# Patient Record
Sex: Female | Born: 1990 | Hispanic: Yes | Marital: Single | Smoking: Current some day smoker
Health system: Southern US, Community
[De-identification: ages and names within clinical notes are randomized; demographics above are authoritative.]

## PROBLEM LIST (undated history)

## (undated) ENCOUNTER — Emergency Department (HOSPITAL_COMMUNITY): Admission: EM | Disposition: A | Discharge status: Home / Self Care | Payer: Medicaid Other

## (undated) DIAGNOSIS — N200 Calculus of kidney: Secondary | ICD-10-CM

## (undated) DIAGNOSIS — R112 Nausea with vomiting, unspecified: Secondary | ICD-10-CM

## (undated) DIAGNOSIS — Z972 Presence of dental prosthetic device (complete) (partial): Secondary | ICD-10-CM

## (undated) DIAGNOSIS — K429 Umbilical hernia without obstruction or gangrene: Secondary | ICD-10-CM

## (undated) DIAGNOSIS — L309 Dermatitis, unspecified: Secondary | ICD-10-CM

## (undated) DIAGNOSIS — L409 Psoriasis, unspecified: Secondary | ICD-10-CM

## (undated) DIAGNOSIS — Z9889 Other specified postprocedural states: Secondary | ICD-10-CM

## (undated) HISTORY — DX: Other specified postprocedural states: Z98.890

---

## 2016-09-21 ENCOUNTER — Emergency Department (HOSPITAL_COMMUNITY)
Admission: EM | Admit: 2016-09-21 | Discharge: 2016-09-21 | Disposition: A | Discharge status: Home / Self Care | Payer: Medicaid Other | Attending: Emergency Medicine | Admitting: Emergency Medicine

## 2016-09-21 ENCOUNTER — Encounter (HOSPITAL_COMMUNITY): Payer: Self-pay

## 2016-09-21 DIAGNOSIS — K047 Periapical abscess without sinus: Secondary | ICD-10-CM | POA: Insufficient documentation

## 2016-09-21 DIAGNOSIS — F17200 Nicotine dependence, unspecified, uncomplicated: Secondary | ICD-10-CM | POA: Insufficient documentation

## 2016-09-21 DIAGNOSIS — K0889 Other specified disorders of teeth and supporting structures: Secondary | ICD-10-CM | POA: Diagnosis present

## 2016-09-21 MED ORDER — HYDROCODONE-ACETAMINOPHEN 5-325 MG PO TABS
1.0000 | ORAL_TABLET | Freq: Four times a day (QID) | ORAL | 0 refills | Status: DC | PRN
Start: 2016-09-21 — End: 2022-04-14

## 2016-09-21 MED ORDER — PENICILLIN V POTASSIUM 500 MG PO TABS: 500.0000 mg | ORAL_TABLET | Freq: Four times a day (QID) | ORAL | 0 refills | Status: AC

## 2016-09-21 MED ORDER — IBUPROFEN 600 MG PO TABS: 600.0000 mg | ORAL_TABLET | Freq: Four times a day (QID) | ORAL | 0 refills | Status: DC | PRN

## 2016-09-21 NOTE — Discharge Instructions (Signed)
Take ibuprofen for pain. Take norco for severe pain only. Take penicillin as prescribed until all gone. Follow up with a dentist. Return if worsening.

## 2016-09-21 NOTE — ED Provider Notes (Signed)
[facility]-EMERGENCY DEPT Provider Note   CSN: [id number] Arrival date & time: 09/21/16  1302   By signing my name below, I, Clovis Pu, attest that this documentation has been prepared under the direction and in the presence of  Danyele Smejkal, PA-C. Electronically Signed: Clovis Pu, ED Scribe. 09/21/16. 2:38 PM.   History   Chief Complaint Chief Complaint  Patient presents with  . Dental Pain    The history is provided by the patient. No language interpreter was used.   HPI Comments:  Megan Cochran is a 26 y.o. female who presents to the Emergency Department complaining of moderate right lower dental pain x several days. Patient states she chipped her right lower tooth few years ago, but no prior infection. Pain with chewing. No pain with opening her mouth. Pt also reports facial swelling. Pt has taken ibuprofen with no relief. She denies any drainage and trouble swallowing. No fever or chills. Pt is not followed by a dentist.   History reviewed. No pertinent past medical history.  There are no active problems to display for this patient.   History reviewed. No pertinent surgical history.  OB History    No data available       Home Medications    Prior to Admission medications   Not on File    Family History No family history on file.  Social History Social History  Substance Use Topics  . Smoking status: Current Every Day Smoker  . Smokeless tobacco: Never Used  . Alcohol use Not on file     Allergies   Patient has no known allergies.   Review of Systems Review of Systems  Constitutional: Negative for chills and fever.  HENT: Positive for dental problem and facial swelling. Negative for trouble swallowing.    Physical Exam Updated Vital Signs BP 112/79 (BP Location: Left Arm)   Pulse 73   Temp 97.8 F (36.6 C) (Oral)   Resp 18   SpO2 98%   Physical Exam  Constitutional: She is oriented to person, place, and time. She appears well-developed  and well-nourished. No distress.  HENT:  Head: Normocephalic and atraumatic.  Widespread dental decay. Right lower first molar chipped to the gumline with surrounding erythema and gum swelling. Tender to touch. There is right lower mandibular swelling, tender to palpation.Trismus. No swelling of the tongue.  Eyes: Conjunctivae are normal.  Cardiovascular: Normal rate.   Pulmonary/Chest: Effort normal.  Abdominal: She exhibits no distension.  Neurological: She is alert and oriented to person, place, and time.  Skin: Skin is warm and dry.  Psychiatric: She has a normal mood and affect.  Nursing note and vitals reviewed.  ED Treatments / Results  DIAGNOSTIC STUDIES:  Oxygen Saturation is 98% on RA, normal by my interpretation.    COORDINATION OF CARE:  2:36 PM Discussed treatment plan with pt at bedside and pt agreed to plan.  Labs (all labs ordered are listed, but only abnormal results are displayed) Labs Reviewed - No data to display  EKG  EKG Interpretation None       Radiology No results found.  Procedures Procedures (including critical care time)  Medications Ordered in ED Medications - No data to display   Initial Impression / Assessment and Plan / ED Course  I have reviewed the triage vital signs and the nursing notes.  Pertinent labs & imaging results that were available during my care of the patient were reviewed by me and considered in my medical decision making (see  chart for details).  Clinical Course     Patient with multiple dental caries, here with acute onset of right facial swelling and increased pain. Exam was consistent with a dental abscess. No drainable pocket on the exam. Will start on penicillin, ibuprofen for pain, Norco for severe pain only. No evidence of ledwigs angina. No difficulty swallowing or breathing. Afebrile. Nontoxic appearing. Stable for discharge home. We'll give dental resources and follow-up with a dentist on call.  Final  Clinical Impressions(s) / ED Diagnoses   Final diagnoses:  Dental abscess    New Prescriptions New Prescriptions   HYDROCODONE-ACETAMINOPHEN (NORCO) 5-325 MG TABLET    Take 1 tablet by mouth every 6 (six) hours as needed for moderate pain.   IBUPROFEN (ADVIL,MOTRIN) 600 MG TABLET    Take 1 tablet (600 mg total) by mouth every 6 (six) hours as needed.   PENICILLIN V POTASSIUM (VEETID) 500 MG TABLET    Take 1 tablet (500 mg total) by mouth 4 (four) times daily.    I personally performed the services described in this documentation, which was scribed in my presence. The recorded information has been reviewed and is accurate.    Jaynie Crumbleatyana Jhoan Schmieder, PA-C 09/21/16 1450    Heide Scaleshristopher J Tegeler, MD 09/21/16 [phone]

## 2016-09-21 NOTE — ED Triage Notes (Signed)
Patient here with right lower broken tooth and gum swelling, pain to same, NAD

## 2016-09-21 NOTE — ED Notes (Signed)
Declined W/C at D/C and was escorted to lobby by RN. 

## 2017-01-22 ENCOUNTER — Emergency Department (HOSPITAL_BASED_OUTPATIENT_CLINIC_OR_DEPARTMENT_OTHER)
Admission: EM | Admit: 2017-01-22 | Discharge: 2017-01-22 | Disposition: A | Discharge status: Home / Self Care | Payer: Medicaid Other | Attending: Emergency Medicine | Admitting: Emergency Medicine

## 2017-01-22 ENCOUNTER — Emergency Department (HOSPITAL_BASED_OUTPATIENT_CLINIC_OR_DEPARTMENT_OTHER): Payer: Medicaid Other

## 2017-01-22 ENCOUNTER — Encounter (HOSPITAL_BASED_OUTPATIENT_CLINIC_OR_DEPARTMENT_OTHER): Payer: Self-pay | Admitting: *Deleted

## 2017-01-22 DIAGNOSIS — R569 Unspecified convulsions: Secondary | ICD-10-CM | POA: Diagnosis not present

## 2017-01-22 DIAGNOSIS — F17200 Nicotine dependence, unspecified, uncomplicated: Secondary | ICD-10-CM | POA: Insufficient documentation

## 2017-01-22 HISTORY — DX: Calculus of kidney: N20.0

## 2017-01-22 HISTORY — DX: Dermatitis, unspecified: L30.9

## 2017-01-22 HISTORY — DX: Umbilical hernia without obstruction or gangrene: K42.9

## 2017-01-22 LAB — CBC WITH DIFFERENTIAL/PLATELET
BASOS PCT: 0 %
Basophils Absolute: 0 10*3/uL (ref 0.0–0.1)
Eosinophils Absolute: 0.2 10*3/uL (ref 0.0–0.7)
Eosinophils Relative: 4 %
HEMATOCRIT: 38.6 % (ref 36.0–46.0)
Hemoglobin: 13.9 g/dL (ref 12.0–15.0)
LYMPHS PCT: 17 %
Lymphs Abs: 1 10*3/uL (ref 0.7–4.0)
MCH: 30.8 pg (ref 26.0–34.0)
MCHC: 36 g/dL (ref 30.0–36.0)
MCV: 85.4 fL (ref 78.0–100.0)
MONO ABS: 0.5 10*3/uL (ref 0.1–1.0)
MONOS PCT: 9 %
NEUTROS ABS: 4.2 10*3/uL (ref 1.7–7.7)
Neutrophils Relative %: 70 %
Platelets: 247 10*3/uL (ref 150–400)
RBC: 4.52 MIL/uL (ref 3.87–5.11)
RDW: 12 % (ref 11.5–15.5)
WBC: 5.9 10*3/uL (ref 4.0–10.5)

## 2017-01-22 LAB — COMPREHENSIVE METABOLIC PANEL
ALBUMIN: 4.5 g/dL (ref 3.5–5.0)
ALK PHOS: 41 U/L (ref 38–126)
ALT: 17 U/L (ref 14–54)
ANION GAP: 8 (ref 5–15)
AST: 20 U/L (ref 15–41)
BUN: 6 mg/dL (ref 6–20)
CALCIUM: 9 mg/dL (ref 8.9–10.3)
CO2: 23 mmol/L (ref 22–32)
Chloride: 105 mmol/L (ref 101–111)
Creatinine, Ser: 0.71 mg/dL (ref 0.44–1.00)
GFR calc Af Amer: >60 mL/min (ref >60)
GFR calc non Af Amer: >60 mL/min (ref >60)
GLUCOSE: 106 mg/dL — AB (ref 65–99)
Potassium: 3.3 mmol/L — ABNORMAL LOW (ref 3.5–5.1)
SODIUM: 136 mmol/L (ref 135–145)
Total Bilirubin: 0.4 mg/dL (ref 0.3–1.2)
Total Protein: 7.1 g/dL (ref 6.5–8.1)

## 2017-01-22 NOTE — ED Provider Notes (Signed)
[facility]   CSN: [id number] Arrival date & time: 01/22/17  [id number]   History   Chief Complaint Chief Complaint  Patient presents with  . Seizures    HPI Megan Cochran is a 26 y.o. female.  HPI  Patient presents to the ED with concern for seizure episode. Past medical history no significant diseases. Patient states that she was trying to fall asleep around 1:30 AM and had a difficult time falling asleep. She states that twice she felt her body tense up and shake for a couple seconds and her vision would go dark. This would last temporarily. After episode she was nervous and could not sleep. She finally "passed out" at around 3:30 AM. Her alarm went off at 4 AM. She was able to get up to turn alarm off. When returning back to bed patient fell asleep. Shortly after her boyfriend noticed that her body was tense and she was shaking. He states that he turned on the lights and saw her tense and shaking. She was staring at him and trying to talk but was unable to. Episode of shaking lasted 3-4 minutes. Patient had no tongue biting, or incontinence. Patient did not hit her head. Patient has never had episodes like this before. Patient returned to cognitive baseline about 5 minutes after episode but was able to get up and walk immediately. Boyfriend called EMS and EMS came to evaluate patient. She declined coming to the ED at that time.  She denies any lingering headache, weakness, vision changes. Denies any new medications. Patient denies any increased stress or anxiety in her life. No family history of seizure disorder.   Past Medical History:  Diagnosis Date  . Kidney stones   . Umbilical hernia     There are no active problems to display for this patient.   Past Surgical History:  Procedure Laterality Date  . CESAREAN SECTION     2008 twins, 2010, 2012    OB History    No data available      Home Medications    Prior to Admission medications     Medication Sig Start Date End Date Taking? Authorizing Provider  ibuprofen (ADVIL,MOTRIN) 600 MG tablet Take 1 tablet (600 mg total) by mouth every 6 (six) hours as needed. 09/21/16  Yes Kirichenko, Lemont Fillersatyana, PA-C  levonorgestrel (MIRENA) 20 MCG/24HR IUD 1 each by Intrauterine route once.   Yes [provider]  HYDROcodone-acetaminophen (NORCO) 5-325 MG tablet Take 1 tablet by mouth every 6 (six) hours as needed for moderate pain. 09/21/16   Jaynie CrumbleKirichenko, Tatyana, PA-C    Family History No family history on file.  Social History Social History  Substance Use Topics  . Smoking status: Current Every Day Smoker  . Smokeless tobacco: Never Used  . Alcohol use Not on file    Allergies   Patient has no known allergies.   Review of Systems Review of Systems  Constitutional: Positive for fatigue. Negative for fever.  HENT: Positive for rhinorrhea.   Respiratory: Negative for shortness of breath.   Cardiovascular: Negative for chest pain.  Neurological: Negative for dizziness and weakness.   Also per HPI  Physical Exam Updated Vital Signs BP 123/87 (BP Location: Left Arm)   Pulse 64   Temp 99.2 F (37.3 C) (Oral)   Resp 16   Ht 5\' 1"  (1.549 m)   Wt 160 lb (72.6 kg)   LMP  (Exact Date)   SpO2 100%   BMI 30.23 kg/m  Physical Exam  Constitutional: She is oriented to person, place, and time. She appears well-developed and well-nourished. No distress.  HENT:  Head: Normocephalic and atraumatic.  Mouth/Throat: Oropharynx is clear and moist.  Eyes: Conjunctivae and EOM are normal. Pupils are equal, round, and reactive to light.  Neck: Normal range of motion. Neck supple. No thyromegaly present.  Cardiovascular: Normal rate, regular rhythm, normal heart sounds and intact distal pulses.   Pulmonary/Chest: Effort normal and breath sounds normal.  Abdominal: Soft. She exhibits no distension. There is no tenderness. There is no guarding.  Musculoskeletal: Normal range of  motion. She exhibits no edema, tenderness or deformity.  Neurological: She is alert and oriented to person, place, and time. She has normal strength. No cranial nerve deficit or sensory deficit. She exhibits normal muscle tone. Coordination normal.  Skin: Skin is warm and dry.  Psychiatric: She has a normal mood and affect.  Nursing note and vitals reviewed.   ED Treatments / Results  Labs (all labs ordered are listed, but only abnormal results are displayed) Labs Reviewed  COMPREHENSIVE METABOLIC PANEL - Abnormal; Notable for the following:       Result Value   Potassium 3.3 (*)    Glucose, Bld 106 (*)    All other components within normal limits  CBC WITH DIFFERENTIAL/PLATELET    EKG  EKG Interpretation None       Radiology Ct Head Wo Contrast  Result Date: 01/22/2017 CLINICAL DATA:  Possible seizure. EXAM: CT HEAD WITHOUT CONTRAST TECHNIQUE: Contiguous axial images were obtained from the base of the skull through the vertex without intravenous contrast. COMPARISON:  None. FINDINGS: Brain: No evidence of an acute infarct, acute hemorrhage, mass lesion, mass effect or hydrocephalus. Vascular: No hyperdense vessel are unexpected calcification. Skull: Normal.  Negative for fracture or focal lesion. Sinuses/Orbits: No acute finding. Other: None. IMPRESSION: Normal exam. Electronically Signed   By: Leanna Battles M.D.   On: 01/22/2017 08:52    Procedures Procedures (including critical care time)  Medications Ordered in ED Medications - No data to display   Initial Impression / Assessment and Plan / ED Course  I have reviewed the triage vital signs and the nursing notes.  Pertinent labs & imaging results that were available during my care of the patient were reviewed by me and considered in my medical decision making (see chart for details).  Patient presents to ED with seizure-like activity 1. History not concerning for true epileptic seizure. However cannot fully rule this  out. Did routine labs and imaging for new seizure. CT head was negative. Labs unremarkable. Patient's seizure activity could've been caused by decreased sleep and increased stress/anxiety. Information provided for her to follow-up with neurologist. Return precautions given.   Final Clinical Impressions(s) / ED Diagnoses   Final diagnoses:  Seizure-like activity [facility])    New Prescriptions New Prescriptions   No medications on file     Pincus Large, DO 01/22/17 1119    Nira Conn, MD 01/22/17 [phone]

## 2017-01-22 NOTE — ED Triage Notes (Signed)
Patient states this morning around 0130 am she went to bed and was having a hard time falling asleep.  States each time she tried to close her eyes, she would have uncontrollable shaking.  States she was finally able to fall asleep.  Around 0430, her alarm went off and her husband woke her up and told her to cut it off.  States when she laid back down, he noticed that her jaw was tight and her eyes were rolling back in her head, and she was shaking, which lasted for approximately 3 minutes.  States EMS called and advised patient to come to the ED.  States now she is very tired and has tooth pain.  No incontinence was noted.

## 2017-01-22 NOTE — Discharge Instructions (Signed)
Do not think you had a "real" seizure but seizure-like activity. Follow-up with neurology - number provided for full evaluation to rule-out seizure activity for sure No medications needed Try and get better rest and reduce stress/anxiety as this could have caused seizure-like activity All results were normal   Please establish with PCP to follow-up with

## 2017-04-04 ENCOUNTER — Encounter (HOSPITAL_BASED_OUTPATIENT_CLINIC_OR_DEPARTMENT_OTHER): Payer: Self-pay | Admitting: Emergency Medicine

## 2017-04-04 ENCOUNTER — Emergency Department (HOSPITAL_BASED_OUTPATIENT_CLINIC_OR_DEPARTMENT_OTHER)
Admission: EM | Admit: 2017-04-04 | Discharge: 2017-04-04 | Disposition: A | Discharge status: Home / Self Care | Payer: Medicaid Other | Attending: Emergency Medicine | Admitting: Emergency Medicine

## 2017-04-04 DIAGNOSIS — F17200 Nicotine dependence, unspecified, uncomplicated: Secondary | ICD-10-CM | POA: Insufficient documentation

## 2017-04-04 DIAGNOSIS — Z79899 Other long term (current) drug therapy: Secondary | ICD-10-CM | POA: Diagnosis not present

## 2017-04-04 DIAGNOSIS — M545 Low back pain, unspecified: Secondary | ICD-10-CM

## 2017-04-04 MED ORDER — NAPROXEN 500 MG PO TABS: 500.0000 mg | ORAL_TABLET | Freq: Two times a day (BID) | ORAL | 0 refills | Status: DC

## 2017-04-04 MED ORDER — HYDROMORPHONE HCL 4 MG PO TABS: 4.0000 mg | ORAL_TABLET | Freq: Four times a day (QID) | ORAL | 0 refills | Status: DC | PRN

## 2017-04-04 NOTE — Discharge Instructions (Signed)
Follow-up with sports medicine upstairs. Take the Naprosyn as directed on a regular basis for 7 days. Supplement with the hydromorphone as needed for pain relief.

## 2017-04-04 NOTE — ED Triage Notes (Signed)
Patient reports that she has had back pain x 2 - 3 days. Went to the Hospital last night and was given Baclofen and still in pain. Reports that it is going down her legs now

## 2017-04-04 NOTE — ED Provider Notes (Signed)
[facility]   CSN: [id number] Arrival date & time: 04/04/17  [id number]     History   Chief Complaint Chief Complaint  Patient presents with  . Back Pain    HPI Megan Cochran is a 26 y.o. female.  HPI  Past Medical History:  Diagnosis Date  . Eczema    Left hand  . Kidney stones   . Umbilical hernia     There are no active problems to display for this patient.   Past Surgical History:  Procedure Laterality Date  . CESAREAN SECTION     2008 twins, 2010, 2012    OB History    No data available       Home Medications    Prior to Admission medications   Medication Sig Start Date End Date Taking? Authorizing Provider  baclofen (LIORESAL) 10 MG tablet Take 10 mg by mouth 3 (three) times daily.   Yes [provider]  HYDROcodone-acetaminophen (NORCO) 5-325 MG tablet Take 1 tablet by mouth every 6 (six) hours as needed for moderate pain. 09/21/16   Kirichenko, Lemont Fillersatyana, PA-C  HYDROmorphone (DILAUDID) 4 MG tablet Take 1 tablet (4 mg total) by mouth every 6 (six) hours as needed for severe pain. 04/04/17   Vanetta MuldersZackowski, Teya Otterson, MD  ibuprofen (ADVIL,MOTRIN) 600 MG tablet Take 1 tablet (600 mg total) by mouth every 6 (six) hours as needed. 09/21/16   Kirichenko, Lemont Fillersatyana, PA-C  levonorgestrel (MIRENA) 20 MCG/24HR IUD 1 each by Intrauterine route once.    [provider]  naproxen (NAPROSYN) 500 MG tablet Take 1 tablet (500 mg total) by mouth 2 (two) times daily. 04/04/17   Vanetta MuldersZackowski, Glynis Hunsucker, MD    Family History History reviewed. No pertinent family history.  Social History Social History  Substance Use Topics  . Smoking status: Current Every Day Smoker  . Smokeless tobacco: Never Used  . Alcohol use No     Allergies   Hydrocodone and Penicillins   Review of Systems Review of Systems   Physical Exam Updated Vital Signs BP 102/72 (BP Location: Left Arm)   Pulse 66   Resp 18   Ht 1.575 m (5\' 2" )   Wt 69.4 kg (153 lb)    SpO2 100%   BMI 27.98 kg/m   Physical Exam   ED Treatments / Results  Labs (all labs ordered are listed, but only abnormal results are displayed) Labs Reviewed - No data to display  EKG  EKG Interpretation None       Radiology No results found.  Procedures Procedures (including critical care time)  Medications Ordered in ED Medications - No data to display   Initial Impression / Assessment and Plan / ED Course  I have reviewed the triage vital signs and the nursing notes.  Pertinent labs & imaging results that were available during my care of the patient were reviewed by me and considered in my medical decision making (see chart for details).     Patient with a complaint of low back pain radiating into the right buttocks area pain mostly on the right side present for 2 weeks. No distal neuro focal deficits. No history of injury. Patient's been on baclofen as well as a prednisone taper.  Patient does not have a primary care doctor locally. We'll give a short course of hydromorphone as well as Naprosyn and have her follow-up with sports medicine.  Final Clinical Impressions(s) / ED Diagnoses   Final diagnoses:  Acute right-sided low back pain without  sciatica    New Prescriptions New Prescriptions   HYDROMORPHONE (DILAUDID) 4 MG TABLET    Take 1 tablet (4 mg total) by mouth every 6 (six) hours as needed for severe pain.   NAPROXEN (NAPROSYN) 500 MG TABLET    Take 1 tablet (500 mg total) by mouth 2 (two) times daily.     Vanetta MuldersZackowski, Zephyra Bernardi, MD 04/04/17 2142

## 2017-04-04 NOTE — ED Notes (Signed)
ED Provider at bedside. 

## 2018-05-18 ENCOUNTER — Emergency Department (HOSPITAL_BASED_OUTPATIENT_CLINIC_OR_DEPARTMENT_OTHER)
Admission: EM | Admit: 2018-05-18 | Discharge: 2018-05-18 | Disposition: A | Discharge status: Home / Self Care | Payer: Medicaid Other | Attending: Emergency Medicine | Admitting: Emergency Medicine

## 2018-05-18 ENCOUNTER — Other Ambulatory Visit: Payer: Self-pay

## 2018-05-18 ENCOUNTER — Encounter (HOSPITAL_BASED_OUTPATIENT_CLINIC_OR_DEPARTMENT_OTHER): Payer: Self-pay

## 2018-05-18 DIAGNOSIS — F17210 Nicotine dependence, cigarettes, uncomplicated: Secondary | ICD-10-CM | POA: Insufficient documentation

## 2018-05-18 DIAGNOSIS — Z79899 Other long term (current) drug therapy: Secondary | ICD-10-CM | POA: Diagnosis not present

## 2018-05-18 DIAGNOSIS — R21 Rash and other nonspecific skin eruption: Secondary | ICD-10-CM | POA: Insufficient documentation

## 2018-05-18 MED ORDER — TRIAMCINOLONE ACETONIDE 0.1 % EX CREA: 1.0000 "application " | TOPICAL_CREAM | Freq: Two times a day (BID) | CUTANEOUS | 0 refills | Status: AC

## 2018-05-18 NOTE — Discharge Instructions (Addendum)
Mix triamcinolone with CeraVe and apply to rash. Follow-up with either PCP or dermatology, dermatology may require a referral from a primary care provider.

## 2018-05-18 NOTE — ED Triage Notes (Signed)
Pt c/o rash to both arms-hx of rash "forever"-worse x 1 week-NAD-steady gait

## 2018-05-18 NOTE — ED Provider Notes (Signed)
[facility] EMERGENCY DEPARTMENT Provider Note   CSN: [id number] Arrival date & time: 05/18/18  1210     History   Chief Complaint Chief Complaint  Patient presents with  . Rash    HPI Megan Cochran is a 27 y.o. female.  27 year old female presents with complaint of rash on her left forearm for several years, with itching, and the emergency room today because she feels that the rash is spreading up her left arm with new spots on her right arm as well.  Patient has a history of sensitive skin, also history of left hand dermatitis.  Denies using new soaps or lotions.  No other complaints or concerns.     Past Medical History:  Diagnosis Date  . Eczema    Left hand  . Kidney stones   . Umbilical hernia     There are no active problems to display for this patient.   Past Surgical History:  Procedure Laterality Date  . CESAREAN SECTION     2008 twins, 2010, 2012     OB History   None      Home Medications    Prior to Admission medications   Medication Sig Start Date End Date Taking? Authorizing Provider  baclofen (LIORESAL) 10 MG tablet Take 10 mg by mouth 3 (three) times daily.    [provider]  HYDROcodone-acetaminophen (NORCO) 5-325 MG tablet Take 1 tablet by mouth every 6 (six) hours as needed for moderate pain. 09/21/16   Kirichenko, Lemont Fillersatyana, PA-C  HYDROmorphone (DILAUDID) 4 MG tablet Take 1 tablet (4 mg total) by mouth every 6 (six) hours as needed for severe pain. 04/04/17   Vanetta MuldersZackowski, Scott, MD  ibuprofen (ADVIL,MOTRIN) 600 MG tablet Take 1 tablet (600 mg total) by mouth every 6 (six) hours as needed. 09/21/16   Kirichenko, Lemont Fillersatyana, PA-C  levonorgestrel (MIRENA) 20 MCG/24HR IUD 1 each by Intrauterine route once.    [provider]  naproxen (NAPROSYN) 500 MG tablet Take 1 tablet (500 mg total) by mouth 2 (two) times daily. 04/04/17   Vanetta MuldersZackowski, Scott, MD  triamcinolone cream (KENALOG) 0.1 % Apply 1 application topically 2 (two)  times daily. 05/18/18   Jeannie FendMurphy, Allona Gondek A, PA-C    Family History No family history on file.  Social History Social History   Tobacco Use  . Smoking status: Current Every Day Smoker    Types: Cigarettes  . Smokeless tobacco: Never Used  Substance Use Topics  . Alcohol use: Yes    Comment: occ  . Drug use: No     Allergies   Hydrocodone and Penicillins   Review of Systems Review of Systems  Constitutional: Negative for fever.  Musculoskeletal: Negative for arthralgias, joint swelling and myalgias.  Skin: Positive for rash. Negative for wound.  Allergic/Immunologic: Negative for immunocompromised state.  Neurological: Negative for weakness.  Hematological: Negative for adenopathy. Does not bruise/bleed easily.  All other systems reviewed and are negative.    Physical Exam Updated Vital Signs BP 115/64 (BP Location: Right Arm)   Pulse 65   Temp 98.7 F (37.1 C) (Oral)   Resp 18   Ht 5\' 1"  (1.549 m)   Wt 74.4 kg   SpO2 98%   BMI 30.99 kg/m   Physical Exam  Constitutional: She is oriented to person, place, and time. She appears well-developed and well-nourished. No distress.  HENT:  Head: Normocephalic and atraumatic.  Pulmonary/Chest: Effort normal.  Neurological: She is alert and oriented to person, place, and time. No  sensory deficit.  Skin: Skin is warm and dry. Rash noted. She is not diaphoretic.     Psychiatric: She has a normal mood and affect. Her behavior is normal.  Nursing note and vitals reviewed.    ED Treatments / Results  Labs (all labs ordered are listed, but only abnormal results are displayed) Labs Reviewed - No data to display  EKG None  Radiology No results found.  Procedures Procedures (including critical care time)  Medications Ordered in ED Medications - No data to display   Initial Impression / Assessment and Plan / ED Course  I have reviewed the triage vital signs and the nursing notes.  Pertinent labs & imaging  results that were available during my care of the patient were reviewed by me and considered in my medical decision making (see chart for details).  Clinical Course as of May 19 1251  Thu May 18, 2018  [id number] 27 year old female resents with chronic rash to left forearm, now spreading.  Requests referral to dermatology, does not have PCP for this referral.  Information given for area dermatologist, advised patient this referral may need to come from PCP and she will need to establish care with a PCP for that.  Patient can try applying triamcinolone and CeraVe to the area and see if this helps with her chronic rash.   [LM]    Clinical Course User Index [LM] Jeannie Fend, PA-C    Final Clinical Impressions(s) / ED Diagnoses   Final diagnoses:  Rash    ED Discharge Orders         Ordered    triamcinolone cream (KENALOG) 0.1 %  2 times daily     05/18/18 1247           Jeannie Fend, PA-C 05/18/18 1252    Pricilla Loveless, MD 05/18/18 1457

## 2018-05-28 IMAGING — CT CT HEAD W/O CM
3 of 4 series · 14 of 47 positions shown, 16 images · non-contrast
Comparison: None.

CLINICAL DATA: Possible seizure.

EXAM:
CT HEAD WITHOUT CONTRAST
TECHNIQUE: Contiguous axial images were obtained from the base of the skull
through the vertex without intravenous contrast.

[Series 2: head wo · axial · 0.39mm/px · z∈[-169,-59]mm · 8 of 30 slices shown, 10 images]
[im 4/30  brain]
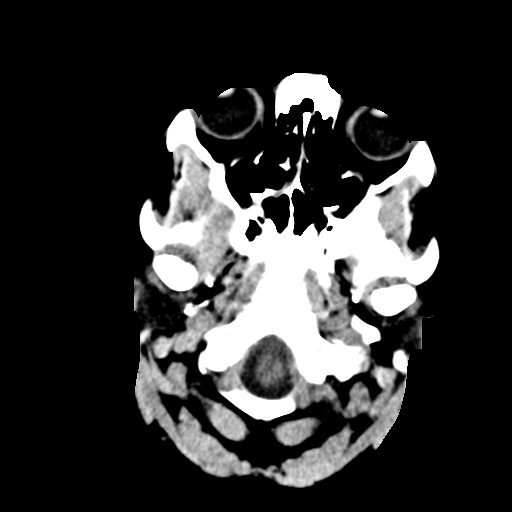
[im 4/30  bone]
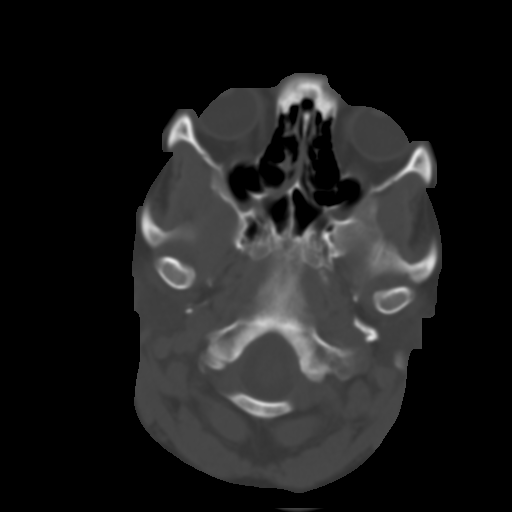
[im 7/30  brain]
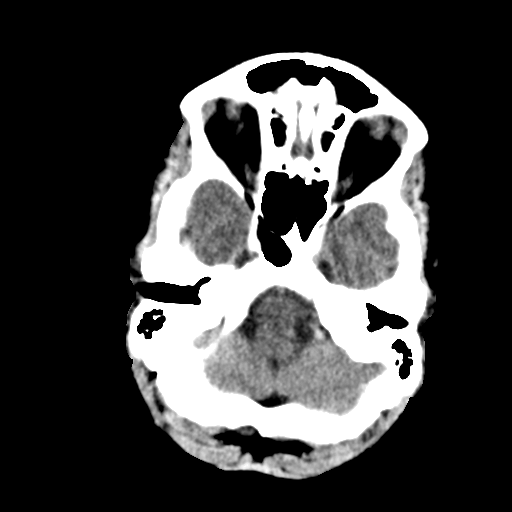
[im 10/30  brain]
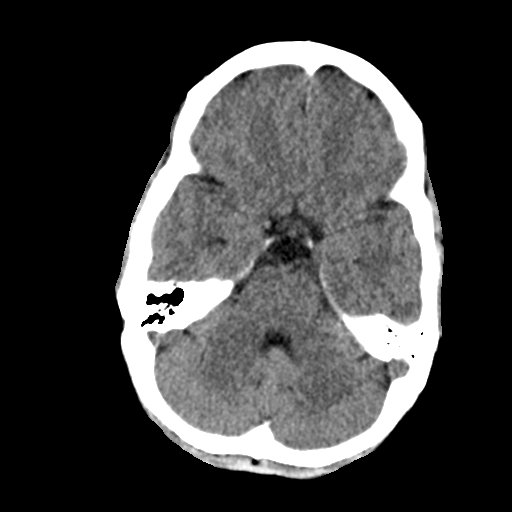
[im 13/30  brain]
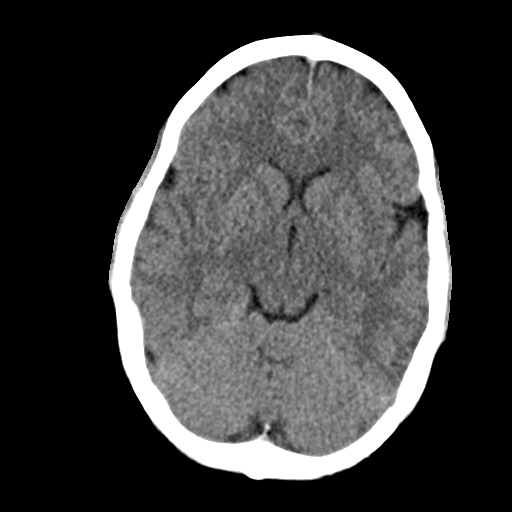
[im 17/30  brain]
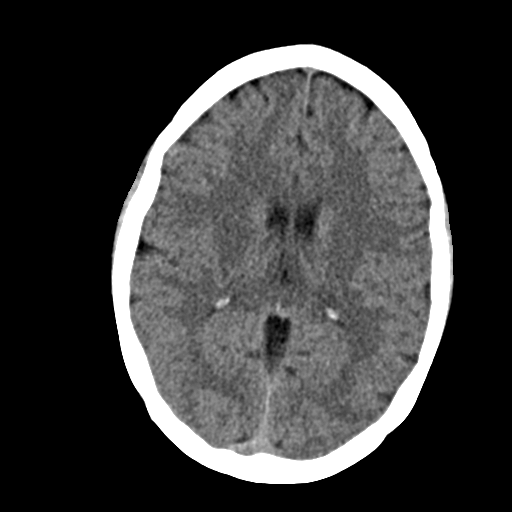
[im 17/30  bone]
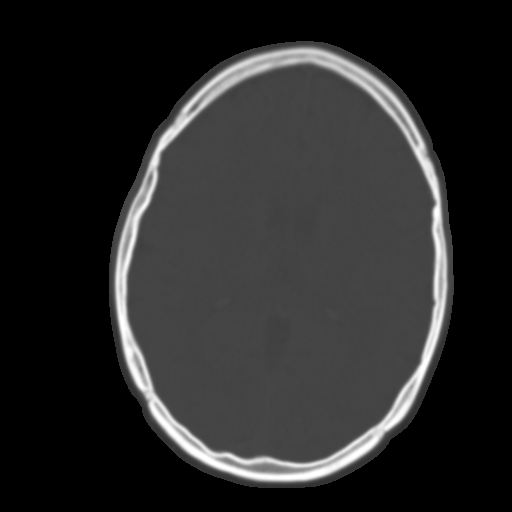
[im 20/30  brain]
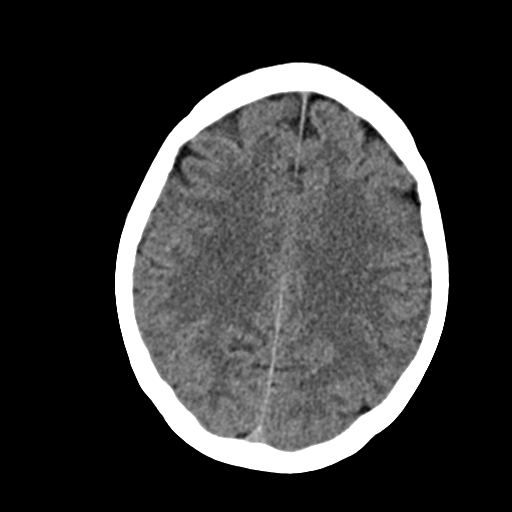
[im 23/30  brain]
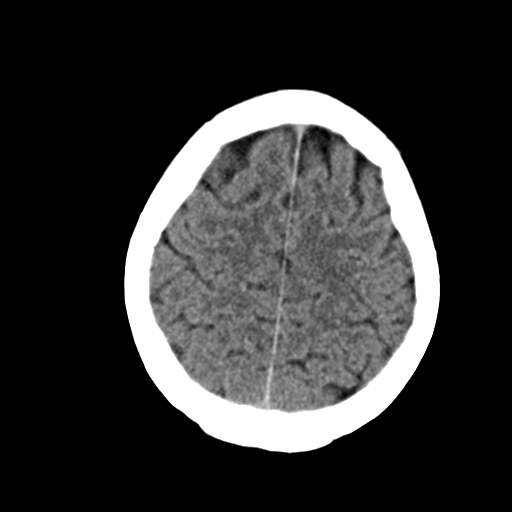
[im 26/30  brain]
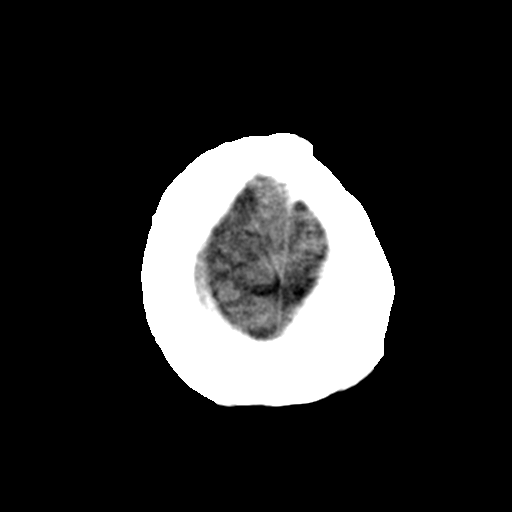

[Series 6: coronal soft · coronal · 0.29mm/px · 3 of 60 slices shown]
[im 20/60  brain]
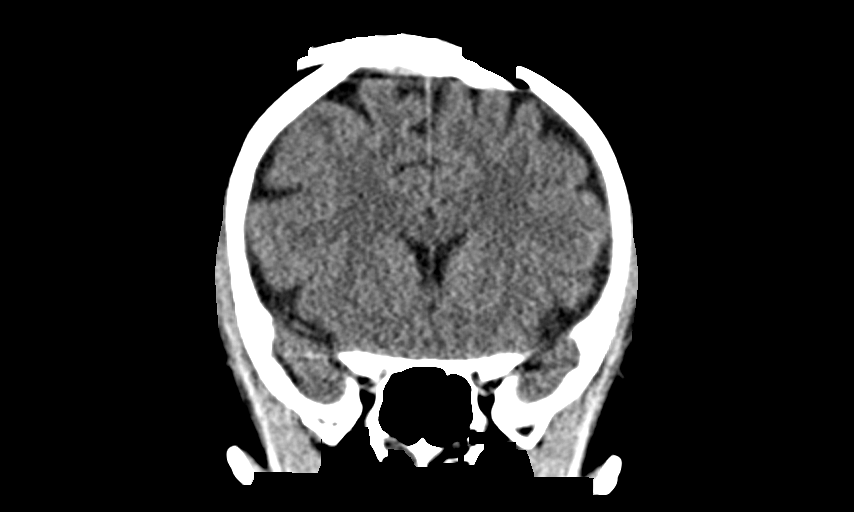
[im 27/60  brain]
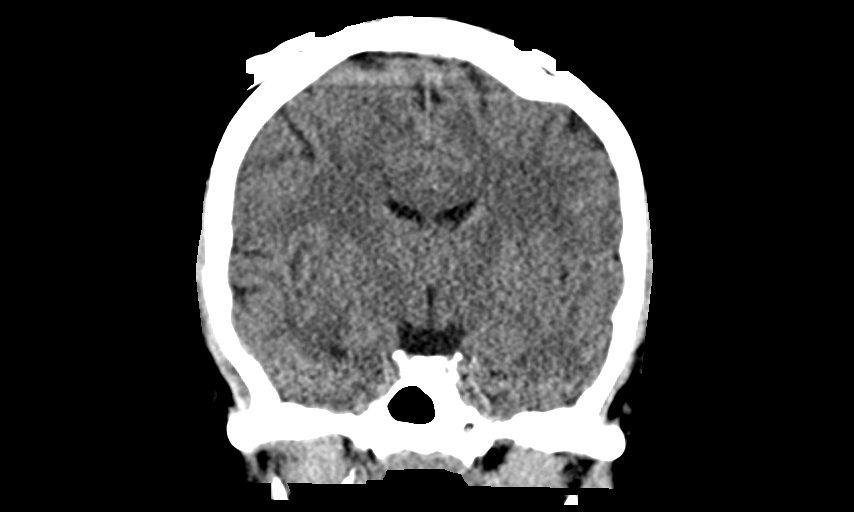
[im 33/60  brain]
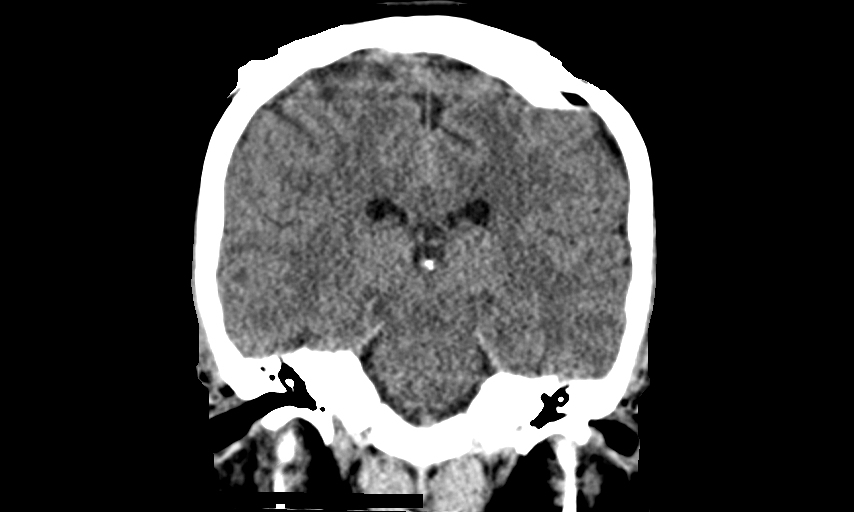

[Series 7: sag soft · sagittal · 0.29mm/px · 3 of 49 slices shown]
[im 17/49  brain]
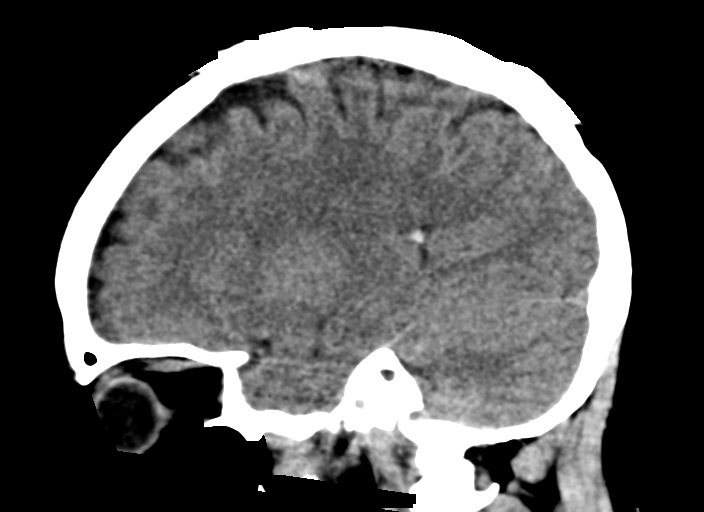
[im 25/49  brain]
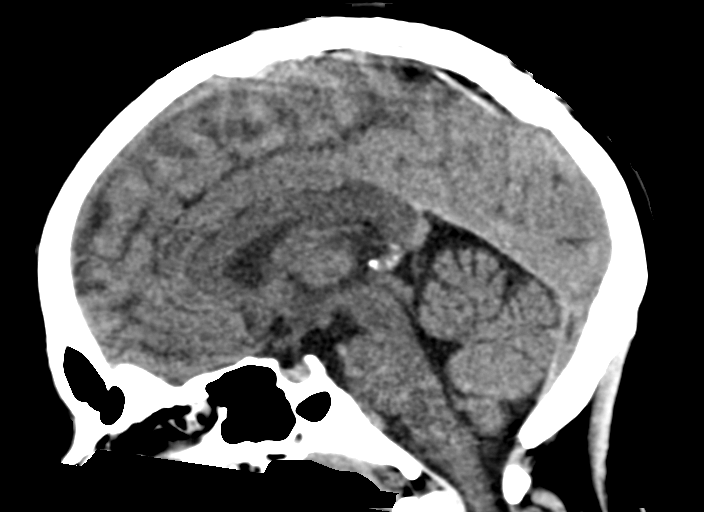
[im 33/49  brain]
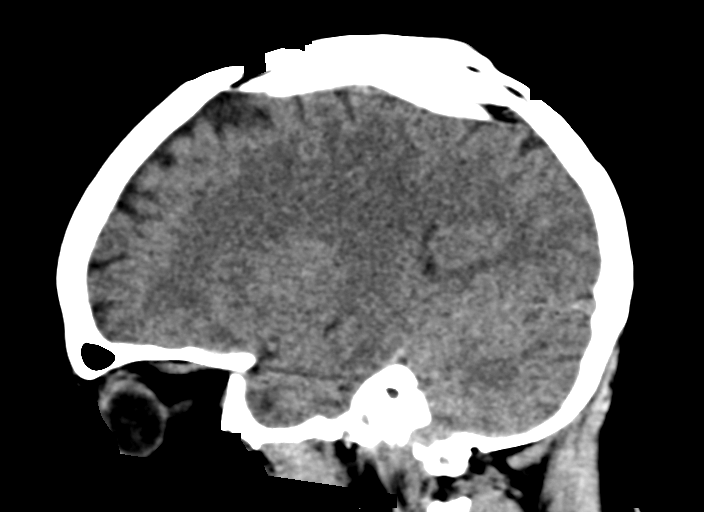

[14 of 47 positions shown; findings below may reference images not displayed]

FINDINGS: Brain: No evidence of an acute infarct, acute hemorrhage, mass
lesion, mass effect or hydrocephalus.

Vascular: No hyperdense vessel are unexpected calcification.

Skull: Normal.  Negative for fracture or focal lesion.

Sinuses/Orbits: No acute finding.

Other: None.
IMPRESSION: Normal exam.

## 2022-04-14 ENCOUNTER — Other Ambulatory Visit: Payer: Self-pay

## 2022-04-14 ENCOUNTER — Encounter (HOSPITAL_BASED_OUTPATIENT_CLINIC_OR_DEPARTMENT_OTHER): Payer: Self-pay | Admitting: Orthopedic Surgery

## 2022-04-14 ENCOUNTER — Other Ambulatory Visit: Payer: Self-pay | Admitting: Orthopedic Surgery

## 2022-04-14 NOTE — Anesthesia Preprocedure Evaluation (Signed)
Anesthesia Evaluation  Patient identified by MRN, date of birth, ID band Patient awake    Reviewed: Allergy & Precautions, NPO status , Patient's Chart, lab work & pertinent test results  History of Anesthesia Complications (+) PONV and history of anesthetic complications  Airway Mallampati: I  TM Distance: >3 FB Neck ROM: Full    Dental  (+) Missing, Dental Advisory Given   Pulmonary Current Smoker and Patient abstained from smoking.,    Pulmonary exam normal breath sounds clear to auscultation       Cardiovascular negative cardio ROS Normal cardiovascular exam Rhythm:Regular Rate:Normal     Neuro/Psych negative neurological ROS  negative psych ROS   GI/Hepatic negative GI ROS, Neg liver ROS,   Endo/Other  negative endocrine ROS  Renal/GU Renal diseaseHx/o renal calculi  negative genitourinary   Musculoskeletal Left hand abscess Psoriasis   Abdominal   Peds  Hematology negative hematology ROS (+)   Anesthesia Other Findings   Reproductive/Obstetrics negative OB ROS                            Anesthesia Physical Anesthesia Plan  ASA: 2  Anesthesia Plan: General   Post-op Pain Management: Precedex, Dilaudid IV and Ketamine IV*   Induction: Intravenous  PONV Risk Score and Plan: 4 or greater and Treatment may vary due to age or medical condition, Midazolam, Dexamethasone and Ondansetron  Airway Management Planned: LMA  Additional Equipment: None  Intra-op Plan:   Post-operative Plan: Extubation in OR  Informed Consent: I have reviewed the patients History and Physical, chart, labs and discussed the procedure including the risks, benefits and alternatives for the proposed anesthesia with the patient or authorized representative who has indicated his/her understanding and acceptance.     Dental advisory given  Plan Discussed with: CRNA and Anesthesiologist  Anesthesia  Plan Comments:        Anesthesia Quick Evaluation

## 2022-04-15 ENCOUNTER — Encounter (HOSPITAL_BASED_OUTPATIENT_CLINIC_OR_DEPARTMENT_OTHER)
Admission: RE | Disposition: A | Discharge status: Home / Self Care | Payer: Self-pay | Source: Home / Self Care | Attending: Orthopedic Surgery

## 2022-04-15 ENCOUNTER — Encounter (HOSPITAL_BASED_OUTPATIENT_CLINIC_OR_DEPARTMENT_OTHER): Payer: Self-pay | Admitting: Orthopedic Surgery

## 2022-04-15 ENCOUNTER — Ambulatory Visit (HOSPITAL_BASED_OUTPATIENT_CLINIC_OR_DEPARTMENT_OTHER)
Admission: RE | Admit: 2022-04-15 | Discharge: 2022-04-15 | Disposition: A | Discharge status: Home / Self Care | Payer: Medicaid Other | Attending: Orthopedic Surgery | Admitting: Orthopedic Surgery

## 2022-04-15 ENCOUNTER — Ambulatory Visit (HOSPITAL_BASED_OUTPATIENT_CLINIC_OR_DEPARTMENT_OTHER): Payer: Medicaid Other | Admitting: Anesthesiology

## 2022-04-15 ENCOUNTER — Other Ambulatory Visit: Payer: Self-pay

## 2022-04-15 DIAGNOSIS — Z419 Encounter for procedure for purposes other than remedying health state, unspecified: Secondary | ICD-10-CM

## 2022-04-15 DIAGNOSIS — F17200 Nicotine dependence, unspecified, uncomplicated: Secondary | ICD-10-CM | POA: Insufficient documentation

## 2022-04-15 DIAGNOSIS — L089 Local infection of the skin and subcutaneous tissue, unspecified: Secondary | ICD-10-CM | POA: Diagnosis not present

## 2022-04-15 DIAGNOSIS — L409 Psoriasis, unspecified: Secondary | ICD-10-CM | POA: Insufficient documentation

## 2022-04-15 DIAGNOSIS — S61452D Open bite of left hand, subsequent encounter: Secondary | ICD-10-CM | POA: Diagnosis present

## 2022-04-15 DIAGNOSIS — W540XXD Bitten by dog, subsequent encounter: Secondary | ICD-10-CM | POA: Insufficient documentation

## 2022-04-15 DIAGNOSIS — L0889 Other specified local infections of the skin and subcutaneous tissue: Secondary | ICD-10-CM | POA: Diagnosis not present

## 2022-04-15 DIAGNOSIS — S61452A Open bite of left hand, initial encounter: Secondary | ICD-10-CM | POA: Diagnosis not present

## 2022-04-15 HISTORY — DX: Psoriasis, unspecified: L40.9

## 2022-04-15 HISTORY — PX: INCISION AND DRAINAGE ABSCESS: SHX5864

## 2022-04-15 HISTORY — DX: Presence of dental prosthetic device (complete) (partial): Z97.2

## 2022-04-15 HISTORY — DX: Other specified postprocedural states: R11.2

## 2022-04-15 SURGERY — INCISION AND DRAINAGE, ABSCESS
Anesthesia: General | Laterality: Left

## 2022-04-15 MED ORDER — FENTANYL CITRATE (PF) 100 MCG/2ML IJ SOLN
INTRAMUSCULAR | Status: AC
  Filled 2022-04-15: qty 2

## 2022-04-15 MED ORDER — VANCOMYCIN HCL IN DEXTROSE 1-5 GM/200ML-% IV SOLN
INTRAVENOUS | Status: AC
  Filled 2022-04-15: qty 200

## 2022-04-15 MED ORDER — DEXAMETHASONE SODIUM PHOSPHATE 4 MG/ML IJ SOLN
INTRAMUSCULAR | Status: DC | PRN
  Administered 2022-04-15: 5 mg via INTRAVENOUS

## 2022-04-15 MED ORDER — ATROPINE SULFATE 0.4 MG/ML IV SOLN
INTRAVENOUS | Status: AC
  Filled 2022-04-15: qty 1

## 2022-04-15 MED ORDER — PHENYLEPHRINE 80 MCG/ML (10ML) SYRINGE FOR IV PUSH (FOR BLOOD PRESSURE SUPPORT)
PREFILLED_SYRINGE | INTRAVENOUS | Status: AC
  Filled 2022-04-15: qty 10

## 2022-04-15 MED ORDER — VANCOMYCIN HCL 1000 MG IV SOLR
INTRAVENOUS | Status: AC
  Filled 2022-04-15: qty 20

## 2022-04-15 MED ORDER — LIDOCAINE 2% (20 MG/ML) 5 ML SYRINGE
INTRAMUSCULAR | Status: AC
  Filled 2022-04-15: qty 5

## 2022-04-15 MED ORDER — LIDOCAINE HCL (CARDIAC) PF 100 MG/5ML IV SOSY
PREFILLED_SYRINGE | INTRAVENOUS | Status: DC | PRN
  Administered 2022-04-15 (×2): 50 mg via INTRAVENOUS

## 2022-04-15 MED ORDER — BUPIVACAINE HCL (PF) 0.25 % IJ SOLN
INTRAMUSCULAR | Status: AC
  Filled 2022-04-15: qty 30

## 2022-04-15 MED ORDER — ONDANSETRON HCL 4 MG/2ML IJ SOLN
INTRAMUSCULAR | Status: DC | PRN
  Administered 2022-04-15: 4 mg via INTRAVENOUS

## 2022-04-15 MED ORDER — BUPIVACAINE HCL (PF) 0.25 % IJ SOLN
INTRAMUSCULAR | Status: DC | PRN
  Administered 2022-04-15: 9 mL

## 2022-04-15 MED ORDER — TRAMADOL HCL 50 MG PO TABS: ORAL_TABLET | ORAL | 0 refills | Status: AC

## 2022-04-15 MED ORDER — ONDANSETRON HCL 4 MG/2ML IJ SOLN
INTRAMUSCULAR | Status: AC
  Filled 2022-04-15: qty 2

## 2022-04-15 MED ORDER — FENTANYL CITRATE (PF) 100 MCG/2ML IJ SOLN
25.0000 ug | INTRAMUSCULAR | Status: DC | PRN
  Administered 2022-04-15: 25 ug via INTRAVENOUS
  Administered 2022-04-15: 50 ug via INTRAVENOUS

## 2022-04-15 MED ORDER — 0.9 % SODIUM CHLORIDE (POUR BTL) OPTIME
TOPICAL | Status: DC | PRN
  Administered 2022-04-15: 100 mL

## 2022-04-15 MED ORDER — EPHEDRINE 5 MG/ML INJ
INTRAVENOUS | Status: AC
  Filled 2022-04-15: qty 5

## 2022-04-15 MED ORDER — CEFAZOLIN SODIUM-DEXTROSE 2-4 GM/100ML-% IV SOLN
INTRAVENOUS | Status: AC
  Filled 2022-04-15: qty 100

## 2022-04-15 MED ORDER — SUCCINYLCHOLINE CHLORIDE 200 MG/10ML IV SOSY
PREFILLED_SYRINGE | INTRAVENOUS | Status: AC
  Filled 2022-04-15: qty 10

## 2022-04-15 MED ORDER — MIDAZOLAM HCL 2 MG/2ML IJ SOLN
INTRAMUSCULAR | Status: AC
  Filled 2022-04-15: qty 2

## 2022-04-15 MED ORDER — OXYCODONE HCL 5 MG/5ML PO SOLN: 5.0000 mg | Freq: Once | ORAL | Status: DC | PRN

## 2022-04-15 MED ORDER — ONDANSETRON HCL 4 MG/2ML IJ SOLN: 4.0000 mg | Freq: Once | INTRAMUSCULAR | Status: DC | PRN

## 2022-04-15 MED ORDER — LACTATED RINGERS IV SOLN: INTRAVENOUS | Status: DC

## 2022-04-15 MED ORDER — FENTANYL CITRATE (PF) 100 MCG/2ML IJ SOLN
INTRAMUSCULAR | Status: DC | PRN
  Administered 2022-04-15: 50 ug via INTRAVENOUS

## 2022-04-15 MED ORDER — OXYCODONE HCL 5 MG PO TABS: 5.0000 mg | ORAL_TABLET | Freq: Once | ORAL | Status: DC | PRN

## 2022-04-15 MED ORDER — DEXAMETHASONE SODIUM PHOSPHATE 10 MG/ML IJ SOLN
INTRAMUSCULAR | Status: AC
  Filled 2022-04-15: qty 1

## 2022-04-15 MED ORDER — MIDAZOLAM HCL 5 MG/5ML IJ SOLN
INTRAMUSCULAR | Status: DC | PRN
  Administered 2022-04-15: 2 mg via INTRAVENOUS

## 2022-04-15 MED ORDER — VANCOMYCIN HCL 1000 MG IV SOLR
INTRAVENOUS | Status: DC | PRN
  Administered 2022-04-15: 1000 mg via INTRAVENOUS

## 2022-04-15 SURGICAL SUPPLY — 53 items
APL PRP STRL LF DISP 70% ISPRP (MISCELLANEOUS) ×1
BAG DECANTER FOR FLEXI CONT (MISCELLANEOUS) IMPLANT
BANDAGE GAUZE 1X75IN STRL (MISCELLANEOUS) IMPLANT
BLADE MINI RND TIP GREEN BEAV (BLADE) IMPLANT
BLADE SURG 15 STRL LF DISP TIS (BLADE) ×2 IMPLANT
BLADE SURG 15 STRL SS (BLADE) ×4
BNDG CMPR 75X11 PLY HI ABS (MISCELLANEOUS)
BNDG CMPR 9X4 STRL LF SNTH (GAUZE/BANDAGES/DRESSINGS)
BNDG COHESIVE 1X5 TAN STRL LF (GAUZE/BANDAGES/DRESSINGS) IMPLANT
BNDG ELASTIC 2X5.8 VLCR STR LF (GAUZE/BANDAGES/DRESSINGS) IMPLANT
BNDG ELASTIC 3X5.8 VLCR STR LF (GAUZE/BANDAGES/DRESSINGS) IMPLANT
BNDG ESMARK 4X9 LF (GAUZE/BANDAGES/DRESSINGS) IMPLANT
BNDG GAUZE 1X75IN STRL (MISCELLANEOUS)
BNDG GAUZE DERMACEA FLUFF (GAUZE/BANDAGES/DRESSINGS)
BNDG GAUZE DERMACEA FLUFF 4 (GAUZE/BANDAGES/DRESSINGS) IMPLANT
BNDG GZE DERMACEA 4 6PLY (GAUZE/BANDAGES/DRESSINGS)
CHLORAPREP W/TINT 26 (MISCELLANEOUS) ×2 IMPLANT
CORD BIPOLAR FORCEPS 12FT (ELECTRODE) ×2 IMPLANT
COVER BACK TABLE 60X90IN (DRAPES) ×2 IMPLANT
COVER MAYO STAND STRL (DRAPES) ×2 IMPLANT
CUFF TOURN SGL QUICK 18X4 (TOURNIQUET CUFF) ×2 IMPLANT
DRAPE EXTREMITY T 121X128X90 (DISPOSABLE) ×2 IMPLANT
DRAPE SURG 17X23 STRL (DRAPES) ×2 IMPLANT
GAUZE PACKING IODOFORM 1/4X15 (PACKING) ×1 IMPLANT
GAUZE SPONGE 4X4 12PLY STRL (GAUZE/BANDAGES/DRESSINGS) ×2 IMPLANT
GAUZE XEROFORM 1X8 LF (GAUZE/BANDAGES/DRESSINGS) ×2 IMPLANT
GLOVE BIO SURGEON STRL SZ7.5 (GLOVE) ×2 IMPLANT
GLOVE BIOGEL PI IND STRL 8 (GLOVE) ×1 IMPLANT
GLOVE BIOGEL PI INDICATOR 8 (GLOVE) ×1
GOWN STRL REUS W/ TWL LRG LVL3 (GOWN DISPOSABLE) ×1 IMPLANT
GOWN STRL REUS W/TWL LRG LVL3 (GOWN DISPOSABLE) ×2
GOWN STRL REUS W/TWL XL LVL3 (GOWN DISPOSABLE) ×2 IMPLANT
LOOP VESSEL MAXI BLUE (MISCELLANEOUS) IMPLANT
NDL HYPO 25X1 1.5 SAFETY (NEEDLE) IMPLANT
NEEDLE HYPO 25X1 1.5 SAFETY (NEEDLE) IMPLANT
NS IRRIG 1000ML POUR BTL (IV SOLUTION) ×2 IMPLANT
PACK BASIN DAY SURGERY FS (CUSTOM PROCEDURE TRAY) ×2 IMPLANT
PAD CAST 3X4 CTTN HI CHSV (CAST SUPPLIES) IMPLANT
PADDING CAST ABS 4INX4YD NS (CAST SUPPLIES) ×1
PADDING CAST ABS COTTON 4X4 ST (CAST SUPPLIES) ×1 IMPLANT
PADDING CAST COTTON 3X4 STRL (CAST SUPPLIES)
SPLINT PLASTER CAST XFAST 3X15 (CAST SUPPLIES) IMPLANT
SPLINT PLASTER XTRA FASTSET 3X (CAST SUPPLIES)
STOCKINETTE 4X48 STRL (DRAPES) ×2 IMPLANT
SUT ETHILON 4 0 PS 2 18 (SUTURE) IMPLANT
SWAB COLLECTION DEVICE MRSA (MISCELLANEOUS) IMPLANT
SWAB CULTURE ESWAB REG 1ML (MISCELLANEOUS) IMPLANT
SYR BULB EAR ULCER 3OZ GRN STR (SYRINGE) ×2 IMPLANT
SYR CONTROL 10ML LL (SYRINGE) IMPLANT
SYR TOOMEY 50ML (SYRINGE) IMPLANT
TOWEL GREEN STERILE FF (TOWEL DISPOSABLE) ×4 IMPLANT
TUBE FEEDING ENTERAL 5FR 16IN (TUBING) IMPLANT
UNDERPAD 30X36 HEAVY ABSORB (UNDERPADS AND DIAPERS) ×2 IMPLANT

## 2022-04-15 NOTE — H&P (Signed)
  Megan Cochran is an 31 y.o. female.   Chief Complaint: infected dog bite HPI: 31 yo female states she was bitten by dog 2 weeks ago on left hand.  Has had draining wound and swollen tender area with erythema.  Seen at [facility] and started on antibiotics and followed up in office.  She wishes to proceed with operative incision and drainage.  Allergies:  Allergies  Allergen Reactions   Hydrocodone Rash   Penicillins Rash    Past Medical History:  Diagnosis Date   Eczema    Left hand   Kidney stones    PONV (postoperative nausea and vomiting)    Psoriasis    Umbilical hernia    Wears dentures     Past Surgical History:  Procedure Laterality Date   CESAREAN SECTION     2008 twins, 2010, 2012    Family History: History reviewed. No pertinent family history.  Social History:   reports that she has been smoking cigarettes. She has never used smokeless tobacco. She reports current alcohol use. She reports that she does not use drugs.  Medications: Medications Prior to Admission  Medication Sig Dispense Refill   ibuprofen (ADVIL,MOTRIN) 600 MG tablet Take 1 tablet (600 mg total) by mouth every 6 (six) hours as needed. 30 tablet 0   triamcinolone cream (KENALOG) 0.1 % Apply 1 application topically 2 (two) times daily. 453.6 g 0   baclofen (LIORESAL) 10 MG tablet Take 10 mg by mouth 3 (three) times daily.     levonorgestrel (MIRENA) 20 MCG/24HR IUD 1 each by Intrauterine route once.     naproxen (NAPROSYN) 500 MG tablet Take 1 tablet (500 mg total) by mouth 2 (two) times daily. 14 tablet 0    No results found for this or any previous visit (from the past 48 hour(s)).  No results found.    Blood pressure 102/64, pulse 61, temperature 98.6 F (37 C), temperature source Oral, resp. rate 14, height 5\' 1"  (1.549 m), weight 59.2 kg, last menstrual period 04/10/2022, SpO2 99 %.  General appearance: alert, cooperative, and appears stated age Head: Normocephalic, without obvious  abnormality, atraumatic Neck: supple, symmetrical, trachea midline Extremities: Intact sensation and capillary refill all digits.  +epl/fpl/io.  Wound in hypothenar eminence left palm.  Fluctuant area in hypothenar eminence Pulses: 2+ and symmetric Skin: Skin color, texture, turgor normal. No rashes or lesions Neurologic: Grossly normal Incision/Wound: as above  Assessment/Plan Left palm abscess.  Plan incision and drainage.  Non operative and operative treatment options have been discussed with the patient and patient wishes to proceed with operative treatment. Risks, benefits, and alternatives of surgery have been discussed and the patient agrees with the plan of care.   06/10/2022 04/15/2022, 7:40 AM

## 2022-04-15 NOTE — Transfer of Care (Signed)
Immediate Anesthesia Transfer of Care Note  Patient: Megan Cochran  Procedure(s) Performed: INCISION AND DRAINAGE LEFT HAND ABSCESS (Left)  Patient Location: PACU  Anesthesia Type:General  Level of Consciousness: sedated  Airway & Oxygen Therapy: Patient Spontanous Breathing and Patient connected to face mask oxygen  Post-op Assessment: Report given to RN  Post vital signs: Reviewed and stable  Last Vitals:  Vitals Value Taken Time  BP    Temp    Pulse    Resp    SpO2      Last Pain:  Vitals:   04/15/22 0720  TempSrc: Oral  PainSc: 5          Complications: No notable events documented.

## 2022-04-15 NOTE — Anesthesia Procedure Notes (Signed)
Procedure Name: LMA Insertion Date/Time: 04/15/2022 7:53 AM  Performed by: Ronnette Hila, CRNAPre-anesthesia Checklist: Patient identified, Emergency Drugs available, Suction available and Patient being monitored Patient Re-evaluated:Patient Re-evaluated prior to induction Oxygen Delivery Method: Circle system utilized Preoxygenation: Pre-oxygenation with 100% oxygen Induction Type: IV induction Ventilation: Mask ventilation without difficulty LMA: LMA inserted LMA Size: 4.0 Number of attempts: 1 Airway Equipment and Method: Bite block Placement Confirmation: positive ETCO2 Tube secured with: Tape Dental Injury: Teeth and Oropharynx as per pre-operative assessment

## 2022-04-15 NOTE — Op Note (Addendum)
NAME: Megan Cochran MEDICAL RECORD NO: [id number] DATE OF BIRTH: 25-May-1991 FACILITY: [facility] LOCATION: [facility] PHYSICIAN: Tami Ribas, MD   OPERATIVE REPORT   DATE OF PROCEDURE: 04/15/22    PREOPERATIVE DIAGNOSIS: Left hand infected dog bite   POSTOPERATIVE DIAGNOSIS: Left hand infected dog bite   PROCEDURE: Incision and drainage left hand infected dog bite   SURGEON:  Betha Loa, M.D.   ASSISTANT: none   ANESTHESIA:  General   INTRAVENOUS FLUIDS:  Per anesthesia flow sheet.   ESTIMATED BLOOD LOSS:  Minimal.   COMPLICATIONS:  None.   SPECIMENS: Cultures to micro   TOURNIQUET TIME:    Total Tourniquet Time Documented: Upper Arm (Left) - 23 minutes Total: Upper Arm (Left) - 23 minutes    DISPOSITION:  Stable to PACU.   INDICATIONS: 31 year old female states she was bitten on the left hand by dog approximately 2 weeks ago.  She has seen the urgent care where she was started on antibiotics.  Follow-up in the office.  She had a swollen fluctuant area at the hypothenar eminence.  She wishes to proceed with incision and drainage in the operating room.  Risks, benefits and alternatives of surgery were discussed including the risks of blood loss, infection, damage to nerves, vessels, tendons, ligaments, bone for surgery, need for additional surgery, complications with wound healing, continued pain, stiffness, , need for repeat irrigation and debridement.  She voiced understanding of these risks and elected to proceed.  OPERATIVE COURSE:  After being identified preoperatively by myself,  the patient and I agreed on the procedure and site of the procedure.  The surgical site was marked.  Surgical consent had been signed. Antibiotics were held for intraoperative cultures. She was transferred to the operating room and placed on the operating table in supine position with the Left upper extremity on an arm board.  General anesthesia was induced by the  anesthesiologist.  Left upper extremity was prepped and draped in normal sterile orthopedic fashion.  A surgical pause was performed between the surgeons, anesthesia, and operating room staff and all were in agreement as to the patient, procedure, and site of procedure.  Tourniquet at the proximal aspect of the extremity was inflated to 250 mmHg after exsanguination of the arm with an Esmarch bandage.  The wound was opened.  There was granulation tissue within the wound.  There was some thin watery fluid.  No gross purulence was encountered.  The granulation tissue was carefully removed by scraping and with the pickups.  The wound was extended proximally and distally to allow better visualization.  The wound edges were sharply removed with a knife to remove devitalized skin edge.  There was some deep vitalized fat in the wound as well.  This was removed.  The area more toward the ulnar side where she was more swollen was opened using the scissors from this wound.  No gross purulence was encountered.  Cultures were taken for aerobes and anaerobes.  The wound went down to the fascia.  The fascia was opened slightly to allow visualization to ensure there was no evidence of infection deep to the fascia.  There was none noted.  The wound was then copiously irrigated with sterile saline.  Was packed with quarter inch endoform gauze.  Was injected with quarter percent plain Marcaine to aid in postoperative analgesia.  Wound was then dressed with sterile Xeroform 4 x 4's and ABD and wrapped with Kerlix and Ace bandage.  The tourniquet was  deflated at 23 minutes.  Fingertips were pink with brisk capillary refill after deflation of tourniquet.  The operative  drapes were broken down.  The patient was awoken from anesthesia safely.  She was transferred back to the stretcher and taken to PACU in stable condition.  I will see her back in the office in 3-4 days for postoperative followup.  I will give her a prescription for  Tramadol 50 mg 1-2 tabs PO q6 hours prn pain, dispense # 20.  She has a prescription for trimethoprim/sulfamethoxazole and clindamycin from the urgent care already.   Betha Loa, MD Electronically signed, 04/15/22

## 2022-04-15 NOTE — Anesthesia Postprocedure Evaluation (Signed)
Anesthesia Post Note  Patient: Kailani Brass  Procedure(s) Performed: INCISION AND DRAINAGE LEFT HAND ABSCESS (Left)     Patient location during evaluation: PACU Anesthesia Type: General Level of consciousness: awake and alert and oriented Pain management: pain level controlled Vital Signs Assessment: post-procedure vital signs reviewed and stable Respiratory status: spontaneous breathing, nonlabored ventilation and respiratory function stable Cardiovascular status: blood pressure returned to baseline and stable Postop Assessment: no apparent nausea or vomiting Anesthetic complications: no   No notable events documented.  Last Vitals:  Vitals:   04/15/22 0850 04/15/22 0900  BP:  114/77  Pulse: 85 78  Resp: 16 13  Temp:    SpO2: 100% 98%    Last Pain:  Vitals:   04/15/22 0858  TempSrc:   PainSc: 0-No pain                 Reighlyn Elmes A.

## 2022-04-15 NOTE — Discharge Instructions (Addendum)

## 2022-04-16 ENCOUNTER — Encounter (HOSPITAL_BASED_OUTPATIENT_CLINIC_OR_DEPARTMENT_OTHER): Payer: Self-pay | Admitting: Orthopedic Surgery

## 2022-04-20 LAB — AEROBIC/ANAEROBIC CULTURE W GRAM STAIN (SURGICAL/DEEP WOUND): Culture: NORMAL

## 2022-10-27 ENCOUNTER — Inpatient Hospital Stay (HOSPITAL_COMMUNITY): Payer: Medicaid Other

## 2022-10-27 ENCOUNTER — Inpatient Hospital Stay (HOSPITAL_COMMUNITY)
Admission: AD | Admit: 2022-10-27 | Discharge: 2022-10-27 | Disposition: A | Discharge status: Home / Self Care | Payer: Medicaid Other | Attending: Obstetrics and Gynecology | Admitting: Obstetrics and Gynecology

## 2022-10-27 DIAGNOSIS — Z3A00 Weeks of gestation of pregnancy not specified: Secondary | ICD-10-CM | POA: Diagnosis not present

## 2022-10-27 DIAGNOSIS — R109 Unspecified abdominal pain: Secondary | ICD-10-CM | POA: Diagnosis not present

## 2022-10-27 DIAGNOSIS — O26899 Other specified pregnancy related conditions, unspecified trimester: Secondary | ICD-10-CM | POA: Insufficient documentation

## 2022-10-27 DIAGNOSIS — Z79899 Other long term (current) drug therapy: Secondary | ICD-10-CM | POA: Insufficient documentation

## 2022-10-27 DIAGNOSIS — Z791 Long term (current) use of non-steroidal anti-inflammatories (NSAID): Secondary | ICD-10-CM | POA: Insufficient documentation

## 2022-10-27 DIAGNOSIS — O3680X0 Pregnancy with inconclusive fetal viability, not applicable or unspecified: Secondary | ICD-10-CM | POA: Diagnosis not present

## 2022-10-27 LAB — COMPREHENSIVE METABOLIC PANEL
ALT: 8 U/L (ref 0–44)
AST: 15 U/L (ref 15–41)
Albumin: 3.8 g/dL (ref 3.5–5.0)
Alkaline Phosphatase: 34 U/L — ABNORMAL LOW (ref 38–126)
Anion gap: 8 (ref 5–15)
BUN: 6 mg/dL (ref 6–20)
CO2: 24 mmol/L (ref 22–32)
Calcium: 9.3 mg/dL (ref 8.9–10.3)
Chloride: 106 mmol/L (ref 98–111)
Creatinine, Ser: 0.66 mg/dL (ref 0.44–1.00)
GFR, Estimated: >60 mL/min (ref >60)
Glucose, Bld: 98 mg/dL (ref 70–99)
Potassium: 4 mmol/L (ref 3.5–5.1)
Sodium: 138 mmol/L (ref 135–145)
Total Bilirubin: 0.5 mg/dL (ref 0.3–1.2)
Total Protein: 6.3 g/dL — ABNORMAL LOW (ref 6.5–8.1)

## 2022-10-27 LAB — CBC
HCT: 39 % (ref 36.0–46.0)
Hemoglobin: 13.1 g/dL (ref 12.0–15.0)
MCH: 30.8 pg (ref 26.0–34.0)
MCHC: 33.6 g/dL (ref 30.0–36.0)
MCV: 91.8 fL (ref 80.0–100.0)
Platelets: 319 10*3/uL (ref 150–400)
RBC: 4.25 MIL/uL (ref 3.87–5.11)
RDW: 11.6 % (ref 11.5–15.5)
WBC: 6.5 10*3/uL (ref 4.0–10.5)
nRBC: 0 % (ref 0.0–0.2)

## 2022-10-27 LAB — POCT PREGNANCY, URINE: Preg Test, Ur: POSITIVE — AB

## 2022-10-27 LAB — ABO/RH
ABO/RH(D): O NEG
Antibody Screen: NEGATIVE

## 2022-10-27 LAB — HCG, QUANTITATIVE, PREGNANCY: hCG, Beta Chain, Quant, S: 7 m[IU]/mL — ABNORMAL HIGH (ref <5)

## 2022-10-27 MED ORDER — RHO D IMMUNE GLOBULIN 1500 UNIT/2ML IJ SOSY
300.0000 ug | PREFILLED_SYRINGE | Freq: Once | INTRAMUSCULAR | Status: AC
  Administered 2022-10-27: 300 ug via INTRAMUSCULAR
  Filled 2022-10-27: qty 2

## 2022-10-27 NOTE — MAU Note (Addendum)
.  Megan Cochran is a 32 y.o. at Unknown here in MAU reporting: Vaginal bleeding after intercourse this morning that is dark red that occurs with wiping. Denies vaginal discharge prior to bleeding. Denies vaginal odors. Positive preg test on 1/18 with [facility] in system.  She reports her LMP was sometime in December. She reports she started bleeding heavily towards the end of February and she reports she bled for 2-3 weeks then her bleeding stopped. She reports she took a pregnancy test after the bleeding stopped and the test was negative.  Positive pregnancy test this morning at home.  Faint positive pregnancy test in MAU.   LMP: December sometime Onset of complaint: this morning Pain score: Denies pain.   Lab orders placed from triage:  POCT Preg

## 2022-10-27 NOTE — MAU Provider Note (Signed)
History     [id number]  Arrival date and time: 10/27/22 1107    Chief Complaint  Patient presents with   Vaginal Bleeding     HPI Megan Cochran is a 32 y.o. at Unknown by LMP with PMHx notable for cesarean x3, short cervix in first pregnancy which was also twin gestation, who presents for vaginal bleeding.   Patient reports she had a positive pregnancy test in December (two months ago)  She had it confirmed one month ago and had a new OB visit scheduled for the beginning of this month, but in between the confirmation and presenting to OB she had a miscarriage with heavy bleeding Subsequently she took a pregnancy test when her bleeding stopped and this was negative Earlier today she was having intercourse with her partner and began to have bleeding She took a pregnancy test this morning and it was again positive so she came to MAU for evaluation Some mild abdominal cramping   --/--/O NEG, O NEG (02/21 1230)  OB History   No obstetric history on file.     Past Medical History:  Diagnosis Date   Eczema    Left hand   Kidney stones    PONV (postoperative nausea and vomiting)    Psoriasis    Umbilical hernia    Wears dentures     Past Surgical History:  Procedure Laterality Date   CESAREAN SECTION     2008 twins, 2010, 2012   INCISION AND DRAINAGE ABSCESS Left 04/15/2022   Procedure: INCISION AND DRAINAGE LEFT HAND ABSCESS;  Surgeon: Leanora Cover, MD;  Location: [facility];  Service: Orthopedics;  Laterality: Left;    No family history on file.  Social History   Socioeconomic History   Marital status: Single    Spouse name: Not on file   Number of children: Not on file   Years of education: Not on file   Highest education level: Not on file  Occupational History   Not on file  Tobacco Use   Smoking status: Some Days    Types: Cigarettes   Smokeless tobacco: Never  Substance and Sexual Activity   Alcohol use: Yes    Comment: occ   Drug  use: No   Sexual activity: Not on file  Other Topics Concern   Not on file  Social History Narrative   Not on file   Social Determinants of Health   Financial Resource Strain: Not on file  Food Insecurity: Not on file  Transportation Needs: Not on file  Physical Activity: Not on file  Stress: Not on file  Social Connections: Not on file  Intimate Partner Violence: Not on file    Allergies  Allergen Reactions   Hydrocodone Rash   Penicillins Rash    No current facility-administered medications on file prior to encounter.   Current Outpatient Medications on File Prior to Encounter  Medication Sig Dispense Refill   baclofen (LIORESAL) 10 MG tablet Take 10 mg by mouth 3 (three) times daily.     ibuprofen (ADVIL,MOTRIN) 600 MG tablet Take 1 tablet (600 mg total) by mouth every 6 (six) hours as needed. 30 tablet 0   levonorgestrel (MIRENA) 20 MCG/24HR IUD 1 each by Intrauterine route once.     naproxen (NAPROSYN) 500 MG tablet Take 1 tablet (500 mg total) by mouth 2 (two) times daily. 14 tablet 0   traMADol (ULTRAM) 50 MG tablet 1-2 tabs PO q6 hours prn pain 20 tablet 0   triamcinolone  cream (KENALOG) 0.1 % Apply 1 application topically 2 (two) times daily. 453.6 g 0     ROS Pertinent positives and negative per HPI, all others reviewed and negative  Physical Exam   BP (!) 101/47 (BP Location: Right Arm)   Pulse 69   Temp 98.6 F (37 C) (Oral)   Resp 17   Ht 5' 1"$  (1.549 m)   Wt 64.9 kg   SpO2 100%   BMI 27.02 kg/m   Patient Vitals for the past 24 hrs:  BP Temp Temp src Pulse Resp SpO2 Height Weight  10/27/22 1141 (!) 101/47 98.6 F (37 C) Oral 69 17 100 % 5' 1"$  (1.549 m) 64.9 kg    Physical Exam Vitals reviewed.  Constitutional:      General: She is not in acute distress.    Appearance: She is well-developed. She is not diaphoretic.  Eyes:     General: No scleral icterus. Pulmonary:     Effort: Pulmonary effort is normal. No respiratory distress.   Abdominal:     General: There is no distension.     Palpations: Abdomen is soft.     Tenderness: There is no abdominal tenderness. There is no guarding or rebound.  Skin:    General: Skin is warm and dry.  Neurological:     Mental Status: She is alert.     Coordination: Coordination normal.      Cervical Exam    Bedside Ultrasound Pt informed that the ultrasound is considered a limited OB ultrasound and is not intended to be a complete ultrasound exam.  Patient also informed that the ultrasound is not being completed with the intent of assessing for fetal or placental anomalies or any pelvic abnormalities.  Explained that the purpose of today's ultrasound is to assess for  viability.  Patient acknowledges the purpose of the exam and the limitations of the study.    My interpretation: uterus without any structures or obvious abnormalities, suspected corpus luteum cyst on L ovary   Labs Results for orders placed or performed during the hospital encounter of 10/27/22 (from the past 24 hour(s))  Pregnancy, urine POC     Status: Abnormal   Collection Time: 10/27/22 11:47 AM  Result Value Ref Range   Preg Test, Ur POSITIVE (A) NEGATIVE  CBC     Status: None   Collection Time: 10/27/22 12:30 PM  Result Value Ref Range   WBC 6.5 4.0 - 10.5 K/uL   RBC 4.25 3.87 - 5.11 MIL/uL   Hemoglobin 13.1 12.0 - 15.0 g/dL   HCT 39.0 36.0 - 46.0 %   MCV 91.8 80.0 - 100.0 fL   MCH 30.8 26.0 - 34.0 pg   MCHC 33.6 30.0 - 36.0 g/dL   RDW 11.6 11.5 - 15.5 %   Platelets 319 150 - 400 K/uL   nRBC 0.0 0.0 - 0.2 %  Comprehensive metabolic panel     Status: Abnormal   Collection Time: 10/27/22 12:30 PM  Result Value Ref Range   Sodium 138 135 - 145 mmol/L   Potassium 4.0 3.5 - 5.1 mmol/L   Chloride 106 98 - 111 mmol/L   CO2 24 22 - 32 mmol/L   Glucose, Bld 98 70 - 99 mg/dL   BUN 6 6 - 20 mg/dL   Creatinine, Ser 0.66 0.44 - 1.00 mg/dL   Calcium 9.3 8.9 - 10.3 mg/dL   Total Protein 6.3 (L) 6.5 -  8.1 g/dL   Albumin 3.8 3.5 - 5.0  g/dL   AST 15 15 - 41 U/L   ALT 8 0 - 44 U/L   Alkaline Phosphatase 34 (L) 38 - 126 U/L   Total Bilirubin 0.5 0.3 - 1.2 mg/dL   GFR, Estimated >60 >60 mL/min   Anion gap 8 5 - 15  hCG, quantitative, pregnancy     Status: Abnormal   Collection Time: 10/27/22 12:30 PM  Result Value Ref Range   hCG, Beta Chain, Quant, S 7 (H) <5 mIU/mL  ABO/Rh     Status: None   Collection Time: 10/27/22 12:30 PM  Result Value Ref Range   ABO/RH(D) O NEG    Antibody Screen      NEG Performed at [facility] [address] [place], [place] [address]   Rh IG workup (includes ABO/Rh)     Status: None (Preliminary result)   Collection Time: 10/27/22 12:30 PM  Result Value Ref Range   Gestational Age(Wks) 4    ABO/RH(D) O NEG    Antibody Screen NEG    Unit Number [id number]    Blood Component Type RHIG    Unit division 00    Status of Unit ISSUED    Transfusion Status      OK TO TRANSFUSE Performed at [facility] [address] [place], [place] [address]     Imaging US OB LESS THAN 14 WEEKS WITH OB TRANSVAGINAL  Result Date: 10/27/2022 CLINICAL DATA:  Positive pregnancy test.  Vaginal bleeding EXAM: OBSTETRIC <14 WK [place] AND TRANSVAGINAL OB TECHNIQUE: Both transabdominal and transvaginal ultrasound examinations were performed for complete evaluation of the gestation as well as the maternal uterus, adnexal regions, and pelvic cul-de-sac. Transvaginal technique was performed to assess early pregnancy. COMPARISON:  None Available. FINDINGS: Intrauterine gestational sac: None Yolk sac:  Not Visualized. Embryo:  Not Visualized. Cardiac Activity: Not Visualized. Subchorionic hemorrhage:  None visualized. Maternal uterus/adnexae: Uterus is heterogeneous with some tram track shadowing consistent with some areas of fibroid infiltration. No intrauterine pregnancy identified. The endometrial stripe measures 5 mm and is slightly heterogeneous. The right ovary has  small follicles. Dimensions 2.4 x 1.3 x 2.1 cm. The left ovary measures 3.7 x 3.1 by 4.3 cm. This has a minimally complex cystic area measuring 3.3 x 2.1 by 3.7 cm. No free fluid in the dependent pelvis. Transabdominal images are limited by overlapping bowel gas and soft tissue IMPRESSION: No intrauterine pregnancy identified. No free fluid. With presence of a positive pregnancy test and no IUP, ectopic in principle is not excluded recommend close follow-up with serial beta HCG and ultrasound. Please correlate with quantification of beta HCG. 3.7 cm mildly complex left-sided ovarian cystic lesion No follow up imaging recommended in the absence of symptoms. Note: This recommendation does not apply to premenarchal patients or to those with increased risk (genetic, family history, elevated tumor markers or other high-risk factors) of ovarian cancer. Reference: Radiology 2019 Nov; 293(2):359-371. Electronically Signed   By: Jill Side M.D.   On: 10/27/2022 13:03    MAU Course  Procedures Lab Orders         CBC         Comprehensive metabolic panel         hCG, quantitative, pregnancy         Pregnancy, urine POC    Meds ordered this encounter  Medications   rho (d) immune globulin (RHIG/RHOPHYLAC) injection 300 mcg   Imaging Orders         US  OB LESS THAN 14 WEEKS WITH OB TRANSVAGINAL     MDM moderate  Assessment and Plan  #Pregnancy of unknown location Pregnancy of unknown location. Discussed with patient that the possibilities include ectopic pregnancy, normal pregnancy, or miscarriage. We discussed need to trend HCG to help determine the outcome of this pregnancy. Scheduled for repeat HCG at [facility] in two days. Emphasized that ectopic pregnancy is a life threatening condition if not diagnosed and treated in a timely manner. Reviewed MAU return precautions of severe and progressively worsening abdominal pain, heavy vaginal bleeding soaking >1 pad/hour, or fever.  Dispo: discharged  to home in stable condition.   Clarnce Flock, MD/MPH 10/27/22 3:01 PM  Allergies as of 10/27/2022       Reactions   Hydrocodone Rash   Penicillins Rash        Medication List     STOP taking these medications    baclofen 10 MG tablet Commonly known as: LIORESAL   ibuprofen 600 MG tablet Commonly known as: ADVIL   levonorgestrel 20 MCG/24HR IUD Commonly known as: MIRENA   naproxen 500 MG tablet Commonly known as: NAPROSYN       TAKE these medications    traMADol 50 MG tablet Commonly known as: Ultram 1-2 tabs PO q6 hours prn pain   triamcinolone cream 0.1 % Commonly known as: KENALOG Apply 1 application topically 2 (two) times daily.

## 2022-10-27 NOTE — Discharge Instructions (Signed)
You were seen in the MAU for abdominal pain. We did blood tests and an ultrasound. You are pregnant, but at present we are not sure if you have an ectopic pregnancy, a normal pregnancy, or a miscarriage. To determine this you will need to have a repeat test of your pregnancy hormone level done in two days. We will schedule you an appointment to have your blood drawn at the [facility] on [address]. Please go there on Friday morning before 10 AM to have your blood drawn.   If you develop severe and progressively worsening abdominal pain, heavy vaginal bleeding soaking >1 pad/hour, or fever, return to the MAU or the nearest hospital immediately.

## 2022-10-28 LAB — RH IG WORKUP (INCLUDES ABO/RH)
ABO/RH(D): O NEG
Antibody Screen: NEGATIVE
Gestational Age(Wks): 4
Unit division: 0

## 2022-10-29 ENCOUNTER — Ambulatory Visit: Payer: Medicaid Other
